# Patient Record
Sex: Male | Born: 1975 | Hispanic: Yes | Marital: Married | Smoking: Never smoker
Health system: Southern US, Community
[De-identification: ages and names within clinical notes are randomized; demographics above are authoritative.]

---

## 2003-01-05 ENCOUNTER — Emergency Department (HOSPITAL_COMMUNITY): Admission: EM | Admit: 2003-01-05 | Discharge: 2003-01-05 | Payer: Self-pay | Admitting: Emergency Medicine

## 2011-05-18 ENCOUNTER — Ambulatory Visit: Payer: Self-pay

## 2011-05-18 DIAGNOSIS — R21 Rash and other nonspecific skin eruption: Secondary | ICD-10-CM

## 2011-08-01 ENCOUNTER — Ambulatory Visit: Payer: Self-pay | Admitting: Family Medicine

## 2011-08-01 VITALS — BP 130/76 | HR 79 | Temp 98.1°F | Resp 16 | Ht 63.5 in | Wt 161.0 lb

## 2011-08-01 DIAGNOSIS — S61219A Laceration without foreign body of unspecified finger without damage to nail, initial encounter: Secondary | ICD-10-CM

## 2011-08-01 DIAGNOSIS — S61209A Unspecified open wound of unspecified finger without damage to nail, initial encounter: Secondary | ICD-10-CM

## 2011-08-01 NOTE — Progress Notes (Signed)
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 His last tetanus shot was less than 5 years ago when he cut his left arm. He's not actively bleeding and has minimal pain.  Objective three-quarter centimeter laceration in the distal phalanx of the left vulvar index finger. No active bleeding, normal sensation, normal range of motion  Assessment: Laceration distal finger

## 2011-08-01 NOTE — Progress Notes (Signed)
   Patient ID: Jariah Jarmon MRN: [id number], DOB: 06-Jul-1975, 36 y.o. Date of Encounter: 08/01/2011, 1:13 PM   PROCEDURE NOTE: Verbal consent obtained. Sterile technique employed. Numbing: Anesthesia obtained with 2% plain lidocaine for a digital block. Cleansed with soap and water. Irrigated. Betadine prep per usual protocol.  Wound explored, no deep structures involved, no foreign bodies.   Wound repaired with # 7 simple interrupted sutures 5-0 Prolene Hemostasis obtained. Wound cleansed and dressed.  Wound care instructions including precautions covered with patient. Handout given.  Anticipate suture removal in 10 days.  Signed, Eula Listen, PA-C 08/01/2011 1:13 PM

## 2011-08-01 NOTE — Patient Instructions (Signed)
Cuidados de una laceracin - Adultos  (Laceration Care, Adult)  Una herida cortante es un corte o lesin que atraviesa todas las capas de la piel y el tejido que se encuentra debajo de la piel.  TRATAMIENTO  Algunas laceraciones no requieren sutura. Algunas no deben cerrarse debido a que puede aumentar el riesgo de infeccin. Es importante que consulte al mdico lo antes posible despus de recibir una lesin para minimizar el riesgo de infeccin y aumentar la posibilidad de que se cierre con xito.  Cuando se cierra adecuadamente, podrn indicarle analgsicos, si los necesita. La herida debe limpiarse para combatir la infeccin. El mdico usar puntos (suturas), grapas,adhesivo, o tiras adhesivas para reparar la laceracin. Estos elementos mantendrn unidos los bordes de la piel para que se cure ms rpidamente y para un mejor resultado cosmtico. Sin embargo, todas las heridas se curarn con una cicatriz. Una vez que la herida se haya curado, las cicatrices pueden minimizarse cubriendo la herida con pantalla solar durante el da por un lapso se 1 ao.  INSTRUCCIONES PARA EL CUIDADO EN EL HOGAR  Si tiene puntos o grapas:   Mantenga la herida limpia y seca.   Si tiene un (vendaje) cmbielo al menos una vez al da. Cmbielo si se moja o se ensucia, o segn las indicaciones del mdico.   Lave el corte dos veces por da con agua y jabn. Enjuguelo con agua para quitar todo el jabn. Seque dando palmaditas con una toalla limpia y seca.   Despus de limpiar, aplique una delgada capa de una crema con antibitico segn las indicaciones del mdico. Esto le ayudar a prevenir las infecciones y a evitar que el vendaje se adhiera.   Puede ducharse despus de las primeras 24 horas. No remoje la herida en agua hasta que le hayan quitado los puntos.   Solo tome medicamentos que se pueden comprar sin receta o recetados para el dolor, malestar o fiebre, como le indica el mdico.   Concurra para que le retiren  los puntos o las grapas cuando el mdico le indique.  En caso que tenga tiras adhesivas:   Mantenga la herida limpia y seca.   No deje que las tiras se mojen. Puede darse un bao cuidando de mantener la herida seca.   Si se moja, squela dando palmaditas con una toalla limpia.   Las tiras caern por s mismas. Puede recortar las tiras a medida que la herida se cura. No quite las tiras que estn pegadas a la herida. Ellas se caern cuando sea el momento.  En caso que le hayan aplicado adhesivo.   Podr mojara momentneamente la herida en la ducha o el bao. No frote ni sumerja la herida. No practique natacin. Evite transpirar con abundancia hasta que el adhesivo se haya cado. Despus de ducharse o darse un bao, seque el corte dando palmaditas con una toalla limpia.   No aplique medicamentos lquidos, en crema o ungentos mientras el adhesivo est en su lugar. Podr aflojarlo antes de que la herida se cure.   Si tiene un vendaje, tenga cuidado de no aplicar cinta adhesiva directamente sobre el adhesivo. Esto puede hacer que el adhesivo se caiga antes de que la herida se haya curado.   Evite la exposicin prolongada a la luz del sol o a la lmpara solar mientras en adhesivo se encuentre en el lugar. La exposicin a los rayos ultravioletas durante el primer ao oscurecer la cicatriz.   El adhesivo permanecer sobre la piel durante 5   a 10 das y luego caer naturalmente. No quite la pelcula de adhesivo.  Deber aplicarse la vacuna contra el ttanos si:  No recuerda cundo se coloc la vacuna la ltima vez.   Nunca recibi esta vacuna.  Si le han aplicado la vacuna contra el ttanos, el brazo podr hincharse, enrojecer y sentirse caliente al tacto. Esto es frecuente y no es un problema. Si usted necesita aplicarse la vacuna y se niega a recibirla, corre riesgo de contraer ttanos. sta es una enfermedad grave.  SOLICITE ATENCIN MDICA SI:   Presenta enrojecimiento, hinchazn o aumento  del dolor en la herida.   Hay rayas rojas que salen de la herida.   Observa un lquido blanco amarillento (pus) en la herida.   Tiene fiebre.   Advierte un olor ftido que proviene de la herida o del vendaje.   La herida se abre luego de que le han extrado las suturas.   Nota que en la herida hay algn cuerpo extrao como un trozo de madera o vidrio.   La herida est en su mano o pie y observa que no puede mover correctamente los dedos.  SOLICITE ATENCIN MDICA DE INMEDIATO SI:   El dolor no se alivia con los medicamentos.   Hay una zona muy hinchada alrededor de la herida que le causa dolor y adormecimiento, o advierte un cambio en el color en el brazo, la mano, la pierna o el pie.   La herida se abre y sangra nuevamente.   Siente que el adormecimiento, la debilidad o la prdida de la funcin de la articulacin que rodea la herida empeoran.   Palpa ndulos dolorosos cerca de la herida o bajo la piel en cualquier zona del cuerpo.  ASEGRESE DE QUE:   Comprende estas instrucciones.   Controlar su enfermedad.   Solicitar ayuda de inmediato si no mejora o si empeora.  Document Released: 04/10/2005 Document Revised: 03/30/2011 Exit[employer] Patient Information 2012 [employer]. 

## 2011-11-19 ENCOUNTER — Ambulatory Visit: Payer: Self-pay

## 2011-11-19 ENCOUNTER — Ambulatory Visit: Payer: Self-pay | Admitting: Family Medicine

## 2011-11-19 VITALS — BP 165/99 | HR 71 | Temp 97.4°F | Resp 18

## 2011-11-19 DIAGNOSIS — M79643 Pain in unspecified hand: Secondary | ICD-10-CM

## 2011-11-19 DIAGNOSIS — S61209A Unspecified open wound of unspecified finger without damage to nail, initial encounter: Secondary | ICD-10-CM

## 2011-11-19 MED ORDER — HYDROCODONE-ACETAMINOPHEN 5-500 MG PO TABS: 1.0000 | ORAL_TABLET | Freq: Four times a day (QID) | ORAL | Status: AC | PRN

## 2011-11-19 MED ORDER — TRAMADOL HCL 50 MG PO TABS: 50.0000 mg | ORAL_TABLET | Freq: Three times a day (TID) | ORAL | Status: AC | PRN

## 2011-11-19 MED ORDER — CEPHALEXIN 500 MG PO CAPS: 500.0000 mg | ORAL_CAPSULE | Freq: Four times a day (QID) | ORAL | Status: AC

## 2011-11-19 NOTE — Progress Notes (Signed)
  [facility]:  Office Visit  Chief Complaint:  Chief Complaint  Patient presents with  . Finger Injury    mowing grass and stick jammed mower and when he removed it , middle and fourth finger got caught in blade.    HPI: Dustin Boyd is a 36 y.o. male who complains of  Right hand injury of 3rd and 4th finger after putting it underneath lawnmower to get a jammed stick. Today about 30 min prior to arrival. Patient is not on blood thinners, is right hand dominant. Has had TDap in 2009 for prior injury. No prior hand or finger injuries requiring surgery. Deneis fevers, chills. + pain  No past medical history on file. No past surgical history on file. History   Social History  . Marital Status: Married    Spouse Name: N/A    Number of Children: N/A  . Years of Education: N/A   Social History Main Topics  . Smoking status: Never Smoker   . Smokeless tobacco: None  . Alcohol Use: None  . Drug Use: None  . Sexually Active: None   Other Topics Concern  . None   Social History Narrative  . None   No family history on file. No Known Allergies Prior to Admission medications   Not on File     ROS: The patient denies fevers, chills, night sweats, unintentional weight loss, chest pain, palpitations, wheezing, dyspnea on exertion, nausea, vomiting, abdominal pain, dysuria, hematuria, melena, numbness, weakness, or tingling.   All other systems have been reviewed and were otherwise negative with the exception of those mentioned in the HPI and as above.    PHYSICAL EXAM: Filed Vitals:   11/19/11 1433  BP: 165/99  Pulse: 71  Temp: 97.4 F (36.3 C)  Resp: 18   There were no vitals filed for this visit. There is no height or weight on file to calculate BMI.  General: Alert, mild  HEENT:  Normocephalic, atraumatic, oropharynx patent.  Cardiovascular:  Regular rate and rhythm, no rubs murmurs or gallops.  No Carotid bruits, radial pulse intact. No pedal  edema.  Respiratory: Clear to auscultation bilaterally.  No wheezes, rales, or rhonchi.  No cyanosis, no use of accessory musculature GI: No organomegaly, abdomen is soft and non-tender, positive bowel sounds.  No masses. Skin: No rashes. Neurologic: Facial musculature symmetric. Psychiatric: Patient is appropriate throughdistress due to painout our interaction. Lymphatic: No cervical lymphadenopathy Musculoskeletal: Gait intact. RIght Hand 3rd and 4th finger -3rd finger tip is cut off, 4th finger deep laceration No appreciable tendon  ROM intact, sensation intact , 5/5 strength + radial pulse intact   LABS: No results found for this or any previous visit.   EKG/XRAY:   Primary read interpreted by Dr[facility] Con[facility] at [facility]. No appreciable acute fx/ dislocation/foreign body;+ soft tissue swelling   ASSESSMENT/PLAN: Encounter Diagnoses  Name Primary?  Marland Kitchen Wound, open, finger Yes  . Hand pain    1. F/u in 2 days 2. Rx Keflex 500 mg QID x 10 days 3. Hydrocodone; acetaminophen 5/325 1-2 tabs PO q6hrs prn #30 4. Tramadol 50 mg #30 prn  5. Wound care BID soap and water.      Hamilton Capri PHUONG, DO 11/19/2011 2:47 PM

## 2011-11-19 NOTE — Progress Notes (Signed)
  Subjective:    Patient ID: Dustin Boyd, male    DOB: 1975-05-04, 36 y.o.   MRN: [id number]  HPI    Review of Systems     Objective:   Physical Exam  Procedure: Consent obtained.  MC block with 2% lido with marcaine 1:1. 4th digit wound closed with 5-0 ethilon #5 SI sutures.  3rd digit lacerations closed with 5-0 ethilon #9 SI sutures and 5-0 vicryl #2 SI at nail edge.  Foil graft placed with 5-0 Ethilon #5 SI sutures and 5-0 vicryl #1 SI suture.  Pt tolerated well.  Drsg placed.  Wound care d/w pt.  Wound care h/o given.  Before foil graft  After foil graft      Assessment & Plan:

## 2011-11-21 ENCOUNTER — Ambulatory Visit (INDEPENDENT_AMBULATORY_CARE_PROVIDER_SITE_OTHER): Payer: Self-pay | Admitting: Physician Assistant

## 2011-11-21 VITALS — BP 118/66 | HR 63 | Temp 98.1°F | Resp 16 | Ht 62.75 in | Wt 163.8 lb

## 2011-11-21 DIAGNOSIS — S61209A Unspecified open wound of unspecified finger without damage to nail, initial encounter: Secondary | ICD-10-CM

## 2011-11-21 NOTE — Progress Notes (Signed)
  Subjective:    Patient ID: Dustin Boyd, male    DOB: 06-27-1975, 36 y.o.   MRN: [id number]  HPI Pt presents for recheck of finger wounds.  He is having less pain and tolerating abx ok.  He has been changing the drsg daily and has seen no signs of infection.   Review of Systems     Objective:   Physical Exam  Constitutional: He is oriented to person, place, and time. He appears well-developed and well-nourished.  HENT:  Head: Normocephalic and atraumatic.  Right Ear: External ear normal.  Left Ear: External ear normal.  Nose: Nose normal.  Eyes: Conjunctivae are normal.  Pulmonary/Chest: Effort normal.  Neurological: He is alert and oriented to person, place, and time.  Skin: Skin is warm and dry.       Wounds uncovered.   4th digit good healing and scab formation.  No signs of infection. 3rd digits good healing with lacerations and foil graft in place with some new blood drainage from under foil.  No signs of infection.  Psychiatric: He has a normal mood and affect. His behavior is normal. Judgment and thought content normal.          Assessment & Plan:  Wound fingers are healing.  Pt to continue wound care.  Drsgs replaced along with fold over splint.  He should recheck in 10days for suture removal and expect the foil graft to not be ready to take off.  Expect that to need 3 wks for healing due to the depth.  Pt to continue abx until completed and pain meds prn.

## 2011-12-04 ENCOUNTER — Ambulatory Visit (INDEPENDENT_AMBULATORY_CARE_PROVIDER_SITE_OTHER): Payer: Self-pay | Admitting: Emergency Medicine

## 2011-12-04 VITALS — BP 118/72 | HR 59 | Temp 98.3°F | Resp 18 | Ht 62.5 in | Wt 163.6 lb

## 2011-12-04 DIAGNOSIS — Z4800 Encounter for change or removal of nonsurgical wound dressing: Secondary | ICD-10-CM

## 2011-12-04 DIAGNOSIS — Z4889 Encounter for other specified surgical aftercare: Secondary | ICD-10-CM

## 2011-12-04 NOTE — Progress Notes (Signed)
  Subjective:    Patient ID: Dustin Boyd, male    DOB: [birth year], 36 y.o.   MRN: [id number]  HPI  Suture removal   Review of Systems As per HPI, otherwise negative.      Objective:   Physical Exam Wound closed satisfactory.  NATI       Assessment & Plan:  Sutures removed Follow up as needed

## 2012-08-23 ENCOUNTER — Ambulatory Visit: Payer: Self-pay

## 2012-08-23 ENCOUNTER — Ambulatory Visit: Payer: Self-pay | Admitting: Family Medicine

## 2012-08-23 VITALS — BP 120/75 | HR 80 | Temp 98.4°F | Resp 18 | Wt 167.0 lb

## 2012-08-23 DIAGNOSIS — S8010XA Contusion of unspecified lower leg, initial encounter: Secondary | ICD-10-CM

## 2012-08-23 DIAGNOSIS — S8011XA Contusion of right lower leg, initial encounter: Secondary | ICD-10-CM

## 2012-08-23 DIAGNOSIS — M76899 Other specified enthesopathies of unspecified lower limb, excluding foot: Secondary | ICD-10-CM

## 2012-08-23 DIAGNOSIS — M7051 Other bursitis of knee, right knee: Secondary | ICD-10-CM

## 2012-08-23 MED ORDER — OXAPROZIN 600 MG PO TABS: ORAL_TABLET | ORAL | Status: AC

## 2012-08-23 NOTE — Progress Notes (Signed)
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 He usually wears knee pads. About 8 days ago he developed swelling below his right knee. It does not have as much pain is just the swelling. Knows of no specific trauma. Some years ago he had a similar problem that was treated with medicines and aspiration.  Objective: Full range of motion of knee. No joint effusion. No major tenderness. Chest below and across the lower portion of the patella, across the area of the patellar tendon, there is a large cystic area 6x9 centimeters.  Assessment: Infrapatellar bursal cyst  Plan: Get x-ray of right knee  [facility] reading (PRIMARY) by  Dr. Alwyn Ren Normal bony structures. Soft tissue swelling is visible on lateral view below the patella.  Procedure note: Sterile technique was used. 1% lidocaine injected locally in the skin and additional anesthetized with ethyl chloride. Aspirated 18 cc of very thick old blood. The lesion went down very nicely.  Assessment: Hematoma infrapatellar cyst  Plan: NSAID agents, compression with a neoprene sleeve, ice, stay off of it today. Return if problems or concerns.  Marland Kitchen

## 2012-08-23 NOTE — Patient Instructions (Signed)
Apply ice for 15 or 20 minutes every couple of hours today.   Stay off work today  Daypro 600 mg one twice daily for inflammation  Purchased a neoprene knee sleeve to wear over the next week or so.  Avoid kneeling on you knee excessively at work over the next few days. 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  Return if problems

## 2012-08-26 ENCOUNTER — Ambulatory Visit: Payer: Self-pay | Admitting: Internal Medicine

## 2012-08-26 VITALS — BP 124/84 | HR 84 | Temp 97.4°F | Resp 16 | Ht 63.0 in | Wt 165.0 lb

## 2012-08-26 DIAGNOSIS — M7051 Other bursitis of knee, right knee: Secondary | ICD-10-CM

## 2012-08-26 DIAGNOSIS — M76899 Other specified enthesopathies of unspecified lower limb, excluding foot: Secondary | ICD-10-CM
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
## 2012-08-26 NOTE — Patient Instructions (Signed)
Continue present medications. Keep knee elevated and keep ice on it a s directed. Get the neoprene sleeve. No activity on your knee for one week.

## 2013-07-27 IMAGING — CR DG KNEE COMPLETE 4+V*R*
4 series · 4 of 4 positions shown · non-contrast
Comparison: None

CLINICAL DATA: Right knee edema.

RIGHT KNEE - COMPLETE 4+ VIEW

[AP]
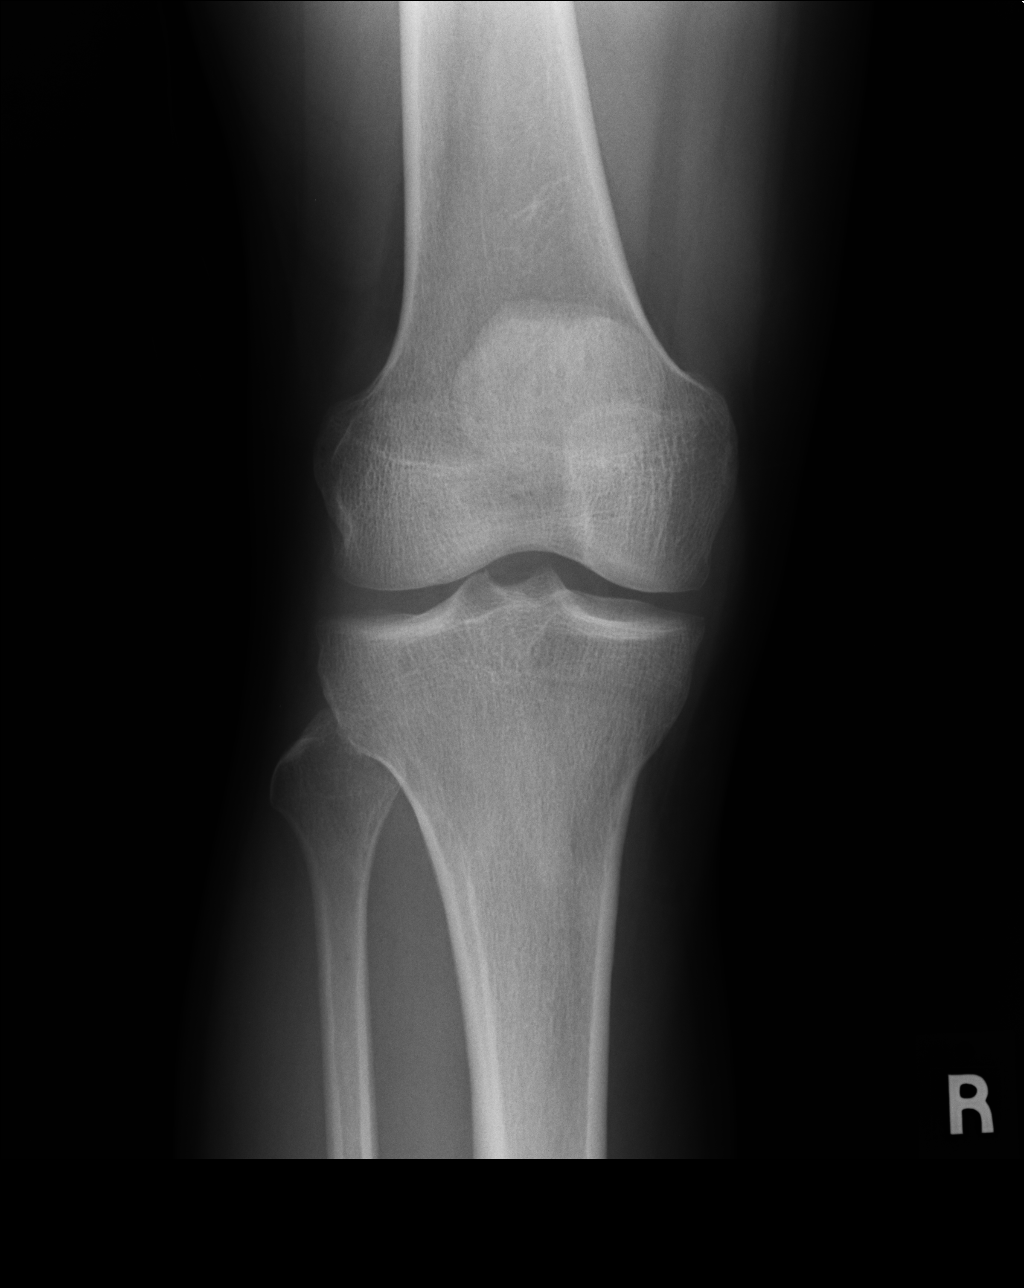

[lateral]
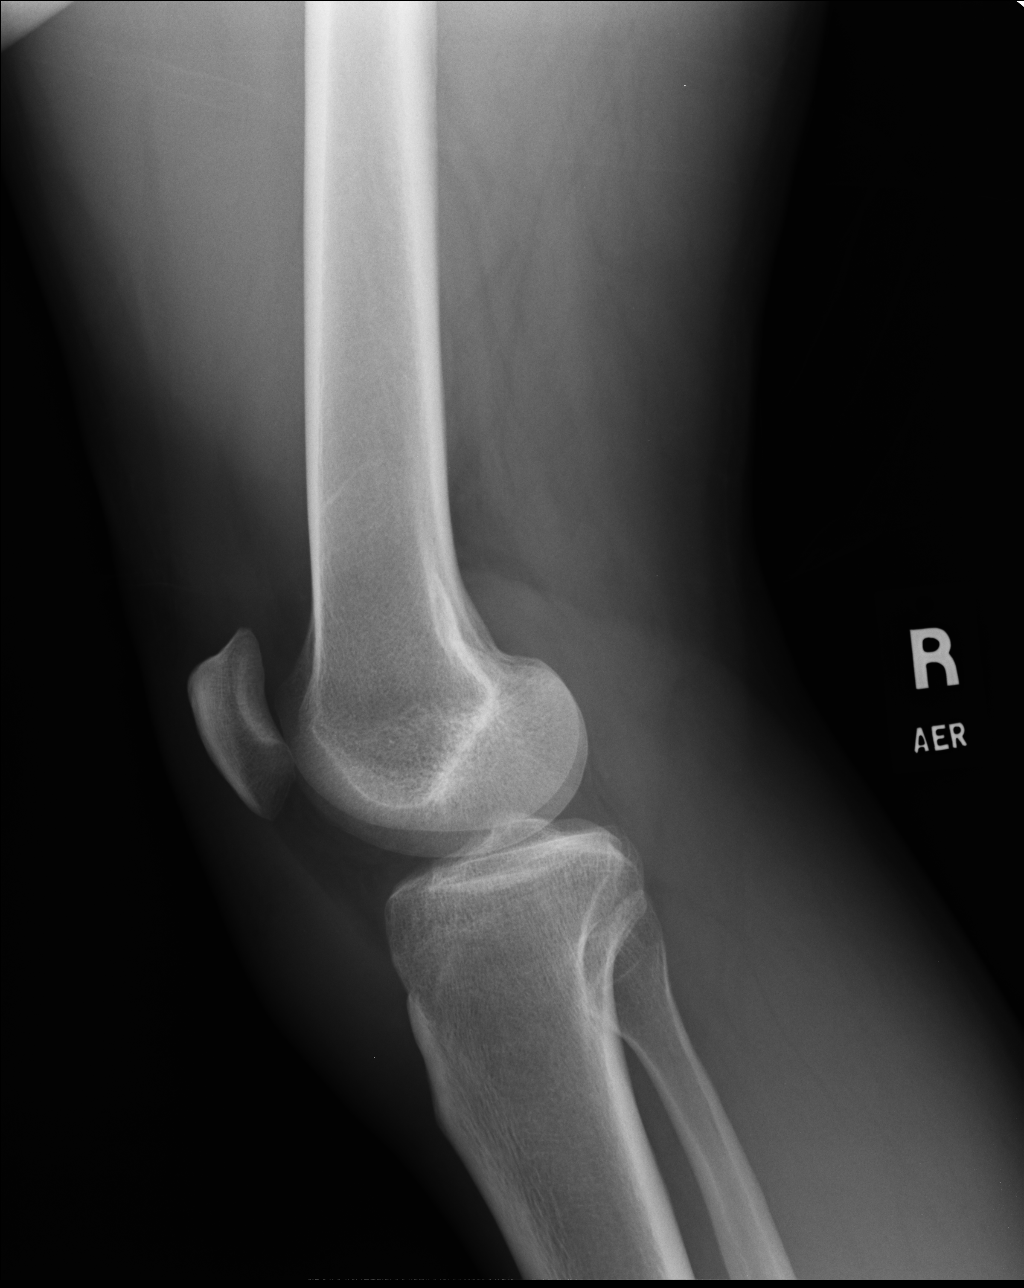

[ap axial]
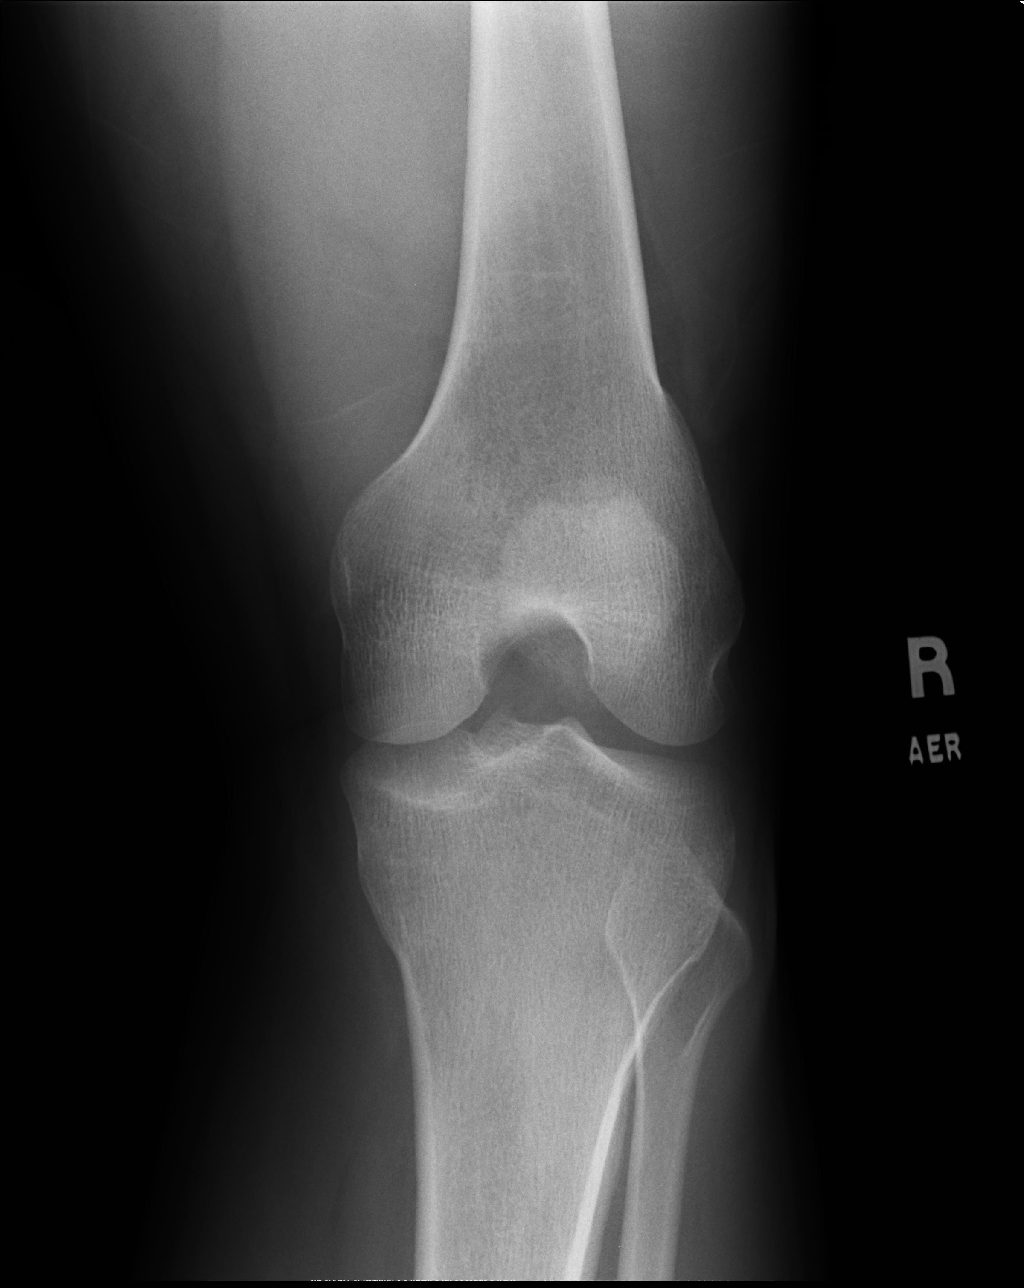

[sunrise]
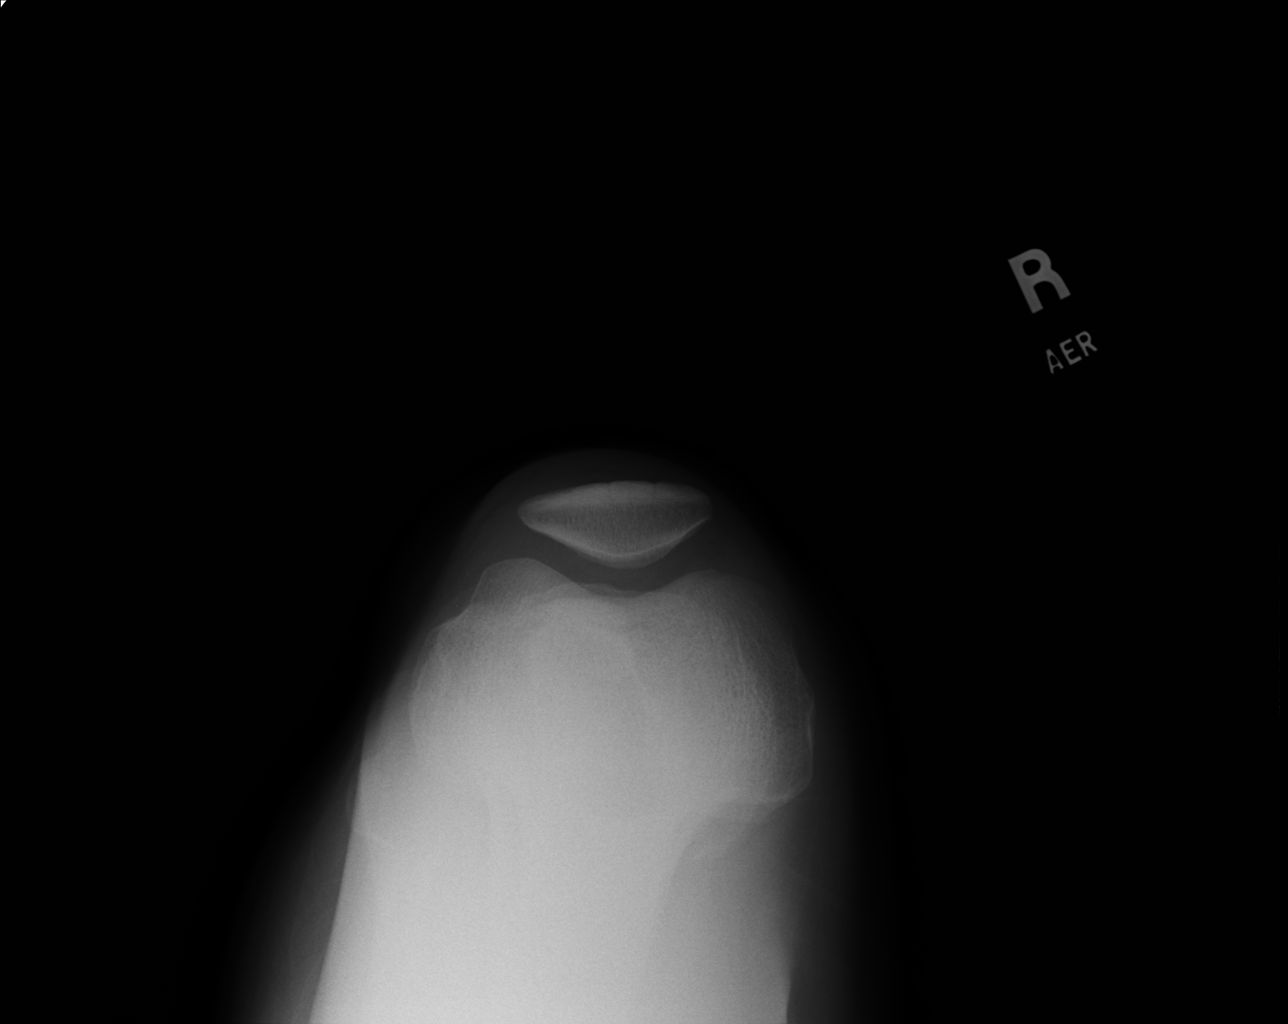

[4 of 4 positions shown; findings below may reference images not displayed]

FINDINGS: Soft tissue swelling noted overlying the proximal tibia
and inferior knee.  No bony abnormality.  No fracture, subluxation
or dislocation.  No joint effusion.
IMPRESSION: Anterior soft tissue swelling over the inferior knee and proximal
tibia.  No underlying bony abnormality.

## 2014-04-02 ENCOUNTER — Ambulatory Visit (INDEPENDENT_AMBULATORY_CARE_PROVIDER_SITE_OTHER): Payer: Self-pay | Admitting: Emergency Medicine

## 2014-04-02 ENCOUNTER — Encounter: Payer: Self-pay | Admitting: Emergency Medicine

## 2014-04-02 VITALS — BP 128/94 | HR 79 | Resp 16

## 2014-04-02 DIAGNOSIS — Z23 Encounter for immunization: Secondary | ICD-10-CM

## 2014-04-02 DIAGNOSIS — S61411A Laceration without foreign body of right hand, initial encounter: Secondary | ICD-10-CM

## 2014-04-02 NOTE — Progress Notes (Signed)
[facility] [address], [place] [id number] [phone]  Date:  04/02/2014   Name:  Dustin FlaxMargarito Rosales Baynes   DOB:  21-Sep-1975   MRN:  [id number]  PCP:  No primary care provider on file.    Chief Complaint: Laceration   History of Present Illness:  Kodey Lelan PonsRosales Asberry is a 38 y.o. very pleasant male patient who presents with the following:  Patient was installing carpet and cut himself with the carpet knife He is not current on TD Denies other complaint or health concern today.   There are no active problems to display for this patient.   History reviewed. No pertinent past medical history.  History reviewed. No pertinent past surgical history.  History  Substance Use Topics  . Smoking status: Never Smoker   . Smokeless tobacco: Not on file  . Alcohol Use: Yes    History reviewed. No pertinent family history.  No Known Allergies  Medication list has been reviewed and updated.  Current Outpatient Prescriptions on File Prior to Visit  Medication Sig Dispense Refill  . cephALEXin (KEFLEX) 500 MG capsule Take 500 mg by mouth 4 (four) times daily.    Marland Kitchen. oxaprozin (DAYPRO) 600 MG tablet Take one twice daily for pain and inflammation in the knee (Patient not taking: Reported on 04/02/2014) 30 tablet 0  . traMADol (ULTRAM) 50 MG tablet Take 50 mg by mouth every 8 (eight) hours as needed.     No current facility-administered medications on file prior to visit.    Review of Systems:  As per HPI, otherwise negative.    Physical Examination: Filed Vitals:   04/02/14 1659  BP: 128/94  Pulse: 79  Resp: 16   There were no vitals filed for this visit. There is no weight on file to calculate BMI. Ideal Body Weight:     GEN: WDWN, NAD, Non-toxic, Alert & Oriented x 3 HEENT: Atraumatic, Normocephalic.  Ears and Nose: No external deformity. EXTR: No clubbing/cyanosis/edema NEURO: Normal gait.  PSYCH: Normally interactive. Conversant.  Not depressed or anxious appearing.  Calm demeanor.  RIGHT hand:  1.5 cm longitudinal laceration dorsum proximal phalange.  NATI.  Extensor visualized. No fb  Assessment and Plan: Laceration right third finger TD Suture repair  Signed,  Phillips OdorJeffery Anderson, MD

## 2014-04-02 NOTE — Progress Notes (Signed)
Procedure Consent obtained. 2cc 1% lido local anesthesia. Cleaned wound with soap and water. Tourniquet applied. Wound explored. #1 4-0 ethilon sutures, #3 4-0 ethilon sutures placed. Cleaned wound with saline. Clean dressing placed. Procedure well tolerated.   Care instructions given. RTC for suture removal in 10 days.

## 2014-04-02 NOTE — Patient Instructions (Signed)
Cuidados de una laceración - Adultos  °(Laceration Care, Adult) ° Una herida cortante es un corte o lesión que atraviesa todas las capas de la piel y el tejido que se encuentra debajo de la piel.  °TRATAMIENTO  °Algunas laceraciones no requieren sutura. Algunas no deben cerrarse debido a que puede aumentar el riesgo de infección. Es importante que consulte al médico lo antes posible después de recibir una lesión para minimizar el riesgo de infección y aumentar la posibilidad de que se cierre con éxito.  °Cuando se cierra adecuadamente, podrán indicarle analgésicos, si los necesita. La herida debe limpiarse para combatir la infección. El médico usará puntos (suturas), grapas,adhesivo, o tiras adhesivas para reparar la laceración. Estos elementos mantendrán unidos los bordes de la piel para que se cure más rápidamente y para un mejor resultado cosmético. Sin embargo, todas las heridas se curarán con una cicatriz. Una vez que la herida se haya curado, las cicatrices pueden minimizarse cubriendo la herida con pantalla solar durante el día por un lapso se 1 año.  °INSTRUCCIONES PARA EL CUIDADO EN EL HOGAR  °Si tiene puntos o grapas:  °· Mantenga la herida limpia y seca. °· Si tiene un (vendaje) cámbielo al menos una vez al día. Cámbielo si se moja o se ensucia, o según las indicaciones del médico. °· Lave el corte dos veces por día con agua y jabón. Enjuáguelo con agua para quitar todo el jabón. Seque dando palmaditas con una toalla limpia y seca. °· Después de limpiar, aplique una delgada capa de una crema con antibiótico según las indicaciones del médico. Esto le ayudará a prevenir las infecciones y a evitar que el vendaje se adhiera. °· Puede ducharse después de las primeras 24 horas. No remoje la herida en agua hasta que le hayan quitado los puntos. °· Solo tome medicamentos que se pueden comprar sin receta o recetados para el dolor, malestar o fiebre, como le indica el médico. °· Concurra para que le retiren los  puntos o las grapas cuando el médico le indique. °En caso que tenga tiras adhesivas:  °· Mantenga la herida limpia y seca. °· No deje que las tiras se mojen. Puede darse un baño cuidando de mantener la herida seca. °· Si se moja, séquela dando palmaditas con una toalla limpia. °· Las tiras caerán por sí mismas. Puede recortar las tiras a medida que la herida se cura. No quite las tiras que están pegadas a la herida. Ellas se caerán cuando sea el momento. °En caso que le hayan aplicado adhesivo.  °· Podrá mojara momentáneamente la herida en la ducha o el baño. No frote ni sumerja la herida. No practique natación. Evite transpirar con abundancia hasta que el adhesivo se haya caído. Después de ducharse o darse un baño, seque el corte dando palmaditas con una toalla limpia. °· No aplique medicamentos líquidos, en crema o ungüentos mientras el adhesivo esté en su lugar. Podrá aflojarlo antes de que la herida se cure. °· Si tiene un vendaje, tenga cuidado de no aplicar cinta adhesiva directamente sobre el adhesivo. Esto puede hacer que el adhesivo se caiga antes de que la herida se haya curado. °· Evite la exposición prolongada a la luz del sol o a la lámpara solar mientras en adhesivo se encuentre en el lugar. La exposición a los rayos ultravioletas durante el primer año oscurecerá la cicatriz. °· El adhesivo permanecerá sobre la piel durante 5 a 10 días y luego caerá naturalmente. No quite la película de adhesivo. °Deberá aplicarse   la vacuna contra el tétanos si: °· No recuerda cuándo se colocó la vacuna la última vez. °· Nunca recibió esta vacuna. °Si le han aplicado la vacuna contra el tétanos, el brazo podrá hincharse, enrojecer y sentirse caliente al tacto. Esto es frecuente y no es un problema. Si usted necesita aplicarse la vacuna y se niega a recibirla, corre riesgo de contraer tétanos. Ésta es una enfermedad grave.  °SOLICITE ATENCIÓN MÉDICA SI:  °· Presenta enrojecimiento, hinchazón o aumento del dolor en la  herida. °· Hay rayas rojas que salen de la herida. °· Observa un líquido blanco amarillento (pus) en la herida. °· Tiene fiebre. °· Advierte un olor fétido que proviene de la herida o del vendaje. °· La herida se abre luego de que le han extraído las suturas. °· Nota que en la herida hay algún cuerpo extraño como un trozo de madera o vidrio. °· La herida está en su mano o pie y observa que no puede mover correctamente los dedos. °SOLICITE ATENCIÓN MÉDICA DE INMEDIATO SI:  °· El dolor no se alivia con los medicamentos. °· Hay una zona muy hinchada alrededor de la herida que le causa dolor y adormecimiento, o advierte un cambio en el color en el brazo, la mano, la pierna o el pie. °· La herida se abre y sangra nuevamente. °· Siente que el adormecimiento, la debilidad o la pérdida de la función de la articulación que rodea la herida empeoran. °· Palpa nódulos dolorosos cerca de la herida o bajo la piel en cualquier zona del cuerpo. °ASEGÚRESE DE QUE:  °· Comprende estas instrucciones. °· Controlará su enfermedad. °· Solicitará ayuda de inmediato si no mejora o si empeora. °Document Released: 04/10/2005 Document Revised: 07/03/2011 °ExitCare® Patient Information ©2015 [employer]. This information is not intended to replace advice given to you by your health care provider. Make sure you discuss any questions you have with your health care provider. ° °
